# Patient Record
Sex: Male | Born: 1970 | Race: White | Hispanic: No | Marital: Married | Smoking: Former smoker
Health system: Southern US, Community
[De-identification: ages and names within clinical notes are randomized; demographics above are authoritative.]

## PROBLEM LIST (undated history)

## (undated) ENCOUNTER — Emergency Department: Discharge status: Absent without leave | Payer: BC Managed Care – PPO

## (undated) DIAGNOSIS — I10 Essential (primary) hypertension: Secondary | ICD-10-CM

## (undated) DIAGNOSIS — G4730 Sleep apnea, unspecified: Secondary | ICD-10-CM

## (undated) DIAGNOSIS — E119 Type 2 diabetes mellitus without complications: Secondary | ICD-10-CM

## (undated) HISTORY — PX: NOSE SURGERY: SHX723

---

## 2003-04-26 ENCOUNTER — Inpatient Hospital Stay (HOSPITAL_COMMUNITY): Admission: EM | Admit: 2003-04-26 | Discharge: 2003-04-27 | Payer: Self-pay | Admitting: Psychiatry

## 2007-05-06 ENCOUNTER — Ambulatory Visit: Payer: Self-pay | Admitting: Nurse Practitioner

## 2007-05-11 ENCOUNTER — Ambulatory Visit: Payer: Self-pay | Admitting: Nurse Practitioner

## 2008-02-13 ENCOUNTER — Emergency Department: Payer: Self-pay | Admitting: Emergency Medicine

## 2009-02-13 ENCOUNTER — Emergency Department: Payer: Self-pay | Admitting: Emergency Medicine

## 2009-03-13 ENCOUNTER — Ambulatory Visit: Payer: Self-pay | Admitting: Otolaryngology

## 2009-03-19 ENCOUNTER — Ambulatory Visit: Payer: Self-pay | Admitting: Otolaryngology

## 2009-05-01 ENCOUNTER — Ambulatory Visit: Payer: Self-pay | Admitting: Family Medicine

## 2009-06-05 ENCOUNTER — Ambulatory Visit: Payer: Self-pay | Admitting: Family Medicine

## 2010-07-22 ENCOUNTER — Ambulatory Visit: Payer: Self-pay | Admitting: Internal Medicine

## 2011-01-22 ENCOUNTER — Ambulatory Visit: Payer: Self-pay | Admitting: Family Medicine

## 2011-01-24 ENCOUNTER — Ambulatory Visit: Payer: Self-pay | Admitting: Internal Medicine

## 2011-01-31 ENCOUNTER — Ambulatory Visit: Payer: Self-pay | Admitting: Internal Medicine

## 2011-11-21 ENCOUNTER — Emergency Department: Payer: Self-pay | Admitting: Emergency Medicine

## 2012-08-06 ENCOUNTER — Emergency Department: Payer: Self-pay | Admitting: Emergency Medicine

## 2012-08-06 LAB — CBC
HCT: 47.2 % (ref 40.0–52.0)
HGB: 16.2 g/dL (ref 13.0–18.0)
MCH: 32.8 pg (ref 26.0–34.0)
MCHC: 34.4 g/dL (ref 32.0–36.0)
WBC: 10.4 10*3/uL (ref 3.8–10.6)

## 2012-08-06 LAB — URINALYSIS, COMPLETE
Bilirubin,UR: NEGATIVE
Glucose,UR: NEGATIVE mg/dL (ref 0–75)
Ketone: NEGATIVE
Protein: NEGATIVE
RBC,UR: 133 /HPF (ref 0–5)
Specific Gravity: 1.023 (ref 1.003–1.030)
Squamous Epithelial: NONE SEEN
WBC UR: 2 /HPF (ref 0–5)

## 2012-08-06 LAB — BASIC METABOLIC PANEL
BUN: 15 mg/dL (ref 7–18)
Creatinine: 0.98 mg/dL (ref 0.60–1.30)
EGFR (Non-African Amer.): >60
Glucose: 109 mg/dL — ABNORMAL HIGH (ref 65–99)
Potassium: 3.8 mmol/L (ref 3.5–5.1)
Sodium: 141 mmol/L (ref 136–145)

## 2012-08-07 ENCOUNTER — Emergency Department: Payer: Self-pay | Admitting: Emergency Medicine

## 2014-08-08 ENCOUNTER — Emergency Department: Payer: Self-pay | Admitting: Internal Medicine

## 2014-08-08 LAB — CBC
HCT: 45.5 % (ref 40.0–52.0)
HGB: 15.4 g/dL (ref 13.0–18.0)
MCH: 31.5 pg (ref 26.0–34.0)
MCHC: 33.8 g/dL (ref 32.0–36.0)
MCV: 93 fL (ref 80–100)
Platelet: 188 10*3/uL (ref 150–440)
RBC: 4.87 10*6/uL (ref 4.40–5.90)
RDW: 12.7 % (ref 11.5–14.5)
WBC: 8.1 10*3/uL (ref 3.8–10.6)

## 2014-08-08 LAB — COMPREHENSIVE METABOLIC PANEL
ALBUMIN: 3.6 g/dL (ref 3.4–5.0)
ALK PHOS: 121 U/L — AB
ALT: 49 U/L
ANION GAP: 8 (ref 7–16)
AST: 30 U/L (ref 15–37)
BILIRUBIN TOTAL: 0.5 mg/dL (ref 0.2–1.0)
BUN: 9 mg/dL (ref 7–18)
CREATININE: 1.08 mg/dL (ref 0.60–1.30)
Calcium, Total: 8.8 mg/dL (ref 8.5–10.1)
Chloride: 101 mmol/L (ref 98–107)
Co2: 25 mmol/L (ref 21–32)
EGFR (African American): >60
GLUCOSE: 498 mg/dL — AB (ref 65–99)
OSMOLALITY: 289 (ref 275–301)
Potassium: 3.9 mmol/L (ref 3.5–5.1)
Sodium: 134 mmol/L — ABNORMAL LOW (ref 136–145)
TOTAL PROTEIN: 7 g/dL (ref 6.4–8.2)

## 2014-08-08 LAB — URINALYSIS, COMPLETE
BILIRUBIN, UR: NEGATIVE
Bacteria: NONE SEEN
Glucose,UR: >=500 mg/dL (ref 0–75)
Ketone: NEGATIVE
LEUKOCYTE ESTERASE: NEGATIVE
NITRITE: NEGATIVE
Ph: 6 (ref 4.5–8.0)
Protein: NEGATIVE
SPECIFIC GRAVITY: 1.027 (ref 1.003–1.030)
Squamous Epithelial: NONE SEEN

## 2014-09-27 ENCOUNTER — Ambulatory Visit: Payer: Self-pay | Admitting: Family Medicine

## 2015-02-05 ENCOUNTER — Encounter: Payer: Self-pay | Admitting: Emergency Medicine

## 2015-02-05 ENCOUNTER — Emergency Department
Admission: EM | Admit: 2015-02-05 | Discharge: 2015-02-05 | Disposition: A | Discharge status: Home / Self Care | Payer: BLUE CROSS/BLUE SHIELD | Attending: Emergency Medicine | Admitting: Emergency Medicine

## 2015-02-05 DIAGNOSIS — Z87891 Personal history of nicotine dependence: Secondary | ICD-10-CM | POA: Insufficient documentation

## 2015-02-05 DIAGNOSIS — I10 Essential (primary) hypertension: Secondary | ICD-10-CM | POA: Diagnosis not present

## 2015-02-05 DIAGNOSIS — R079 Chest pain, unspecified: Secondary | ICD-10-CM | POA: Diagnosis present

## 2015-02-05 DIAGNOSIS — K2970 Gastritis, unspecified, without bleeding: Secondary | ICD-10-CM

## 2015-02-05 DIAGNOSIS — E119 Type 2 diabetes mellitus without complications: Secondary | ICD-10-CM | POA: Insufficient documentation

## 2015-02-05 HISTORY — DX: Essential (primary) hypertension: I10

## 2015-02-05 HISTORY — DX: Type 2 diabetes mellitus without complications: E11.9

## 2015-02-05 HISTORY — DX: Sleep apnea, unspecified: G47.30

## 2015-02-05 LAB — BASIC METABOLIC PANEL
Anion gap: 4 — ABNORMAL LOW (ref 5–15)
BUN: 16 mg/dL (ref 6–20)
CO2: 26 mmol/L (ref 22–32)
Calcium: 8.9 mg/dL (ref 8.9–10.3)
Chloride: 109 mmol/L (ref 101–111)
Creatinine, Ser: 0.94 mg/dL (ref 0.61–1.24)
GFR calc non Af Amer: >60 mL/min (ref >60)
GLUCOSE: 130 mg/dL — AB (ref 65–99)
POTASSIUM: 4.1 mmol/L (ref 3.5–5.1)
Sodium: 139 mmol/L (ref 135–145)

## 2015-02-05 LAB — CBC
HEMATOCRIT: 47.1 % (ref 40.0–52.0)
Hemoglobin: 15.6 g/dL (ref 13.0–18.0)
MCH: 31.1 pg (ref 26.0–34.0)
MCHC: 33.2 g/dL (ref 32.0–36.0)
MCV: 93.7 fL (ref 80.0–100.0)
Platelets: 216 10*3/uL (ref 150–440)
RBC: 5.03 MIL/uL (ref 4.40–5.90)
RDW: 12.6 % (ref 11.5–14.5)
WBC: 9.8 10*3/uL (ref 3.8–10.6)

## 2015-02-05 LAB — TROPONIN I: Troponin I: <0.03 ng/mL (ref <0.031)

## 2015-02-05 MED ORDER — FAMOTIDINE 40 MG PO TABS: 40.0000 mg | ORAL_TABLET | Freq: Every day | ORAL | Status: DC

## 2015-02-05 MED ORDER — GI COCKTAIL ~~LOC~~
30.0000 mL | Freq: Once | ORAL | Status: AC
  Administered 2015-02-05: 30 mL via ORAL

## 2015-02-05 MED ORDER — SUCRALFATE 1 G PO TABS
1.0000 g | ORAL_TABLET | Freq: Four times a day (QID) | ORAL | Status: DC
Start: 2015-02-05 — End: 2017-12-10

## 2015-02-05 MED ORDER — GI COCKTAIL ~~LOC~~
ORAL | Status: AC
Start: 2015-02-05 — End: 2015-02-05
  Administered 2015-02-05: 30 mL via ORAL
  Filled 2015-02-05: qty 30

## 2015-02-05 NOTE — Discharge Instructions (Signed)
Please seek medical attention for any high fevers, chest pain, shortness of breath, change in behavior, persistent vomiting, bloody stool or any other new or concerning symptoms. ° °Gastritis, Adult °Gastritis is soreness and swelling (inflammation) of the lining of the stomach. Gastritis can develop as a sudden onset (acute) or Belter-term (chronic) condition. If gastritis is not treated, it can lead to stomach bleeding and ulcers. °CAUSES  °Gastritis occurs when the stomach lining is weak or damaged. Digestive juices from the stomach then inflame the weakened stomach lining. The stomach lining may be weak or damaged due to viral or bacterial infections. One common bacterial infection is the Helicobacter pylori infection. Gastritis can also result from excessive alcohol consumption, taking certain medicines, or having too much acid in the stomach.  °SYMPTOMS  °In some cases, there are no symptoms. When symptoms are present, they may include: °· Pain or a burning sensation in the upper abdomen. °· Nausea. °· Vomiting. °· An uncomfortable feeling of fullness after eating. °DIAGNOSIS  °Your caregiver may suspect you have gastritis based on your symptoms and a physical exam. To determine the cause of your gastritis, your caregiver may perform the following: °· Blood or stool tests to check for the H pylori bacterium. °· Gastroscopy. A thin, flexible tube (endoscope) is passed down the esophagus and into the stomach. The endoscope has a light and camera on the end. Your caregiver uses the endoscope to view the inside of the stomach. °· Taking a tissue sample (biopsy) from the stomach to examine under a microscope. °TREATMENT  °Depending on the cause of your gastritis, medicines may be prescribed. If you have a bacterial infection, such as an H pylori infection, antibiotics may be given. If your gastritis is caused by too much acid in the stomach, H2 blockers or antacids may be given. Your caregiver may recommend that you  stop taking aspirin, ibuprofen, or other nonsteroidal anti-inflammatory drugs (NSAIDs). °HOME CARE INSTRUCTIONS °· Only take over-the-counter or prescription medicines as directed by your caregiver. °· If you were given antibiotic medicines, take them as directed. Finish them even if you start to feel better. °· Drink enough fluids to keep your urine clear or pale yellow. °· Avoid foods and drinks that make your symptoms worse, such as: °¨ Caffeine or alcoholic drinks. °¨ Chocolate. °¨ Peppermint or mint flavorings. °¨ Garlic and onions. °¨ Spicy foods. °¨ Citrus fruits, such as oranges, lemons, or limes. °¨ Tomato-based foods such as sauce, chili, salsa, and pizza. °¨ Fried and fatty foods. °· Eat small, frequent meals instead of large meals. °SEEK IMMEDIATE MEDICAL CARE IF:  °· You have black or dark red stools. °· You vomit blood or material that looks like coffee grounds. °· You are unable to keep fluids down. °· Your abdominal pain gets worse. °· You have a fever. °· You do not feel better after 1 week. °· You have any other questions or concerns. °MAKE SURE YOU: °· Understand these instructions. °· Will watch your condition. °· Will get help right away if you are not doing well or get worse. °Document Released: 08/05/2001 Document Revised: 02/10/2012 Document Reviewed: 09/24/2011 °[employer]® Patient Information ©2015 [employer]. This information is not intended to replace advice given to you by your health care provider. Make sure you discuss any questions you have with your health care provider. ° °

## 2015-02-05 NOTE — ED Notes (Signed)
Back pain / diarrhea ( dark stool, jet black) , x2 days , chest pain/ RUQ pain x1 day . Came by EMS from from [facility] care , was given aspirin and nitro

## 2015-02-05 NOTE — ED Notes (Signed)
States he developed pain to RLQ   Describes as squeezing non radiating. Pain became worse this am went to see pmd. And instructed to come to ED  Presents with family via ems.

## 2015-02-05 NOTE — ED Provider Notes (Signed)
[facility] Emergency Department Provider Note    ____________________________________________  Time seen: 1100   I have reviewed the triage vital signs and the nursing notes.   HISTORY  Chief Complaint Chest Pain   History limited by: Not Limited   HPI Justin Powell is a 44 y.o. male presents to the emergency department today from physician's office because of concerns for chest pain and abdominal pain.the patient states that he has had some abdominal pain for 1 day. It is located in the upper abdomen. He states that he also has some black stool yesterday. Patient has had some nausea no vomiting. Denies any fevers. He also had an episode of some epigastric and right lower chest pain. Patient denies similar symptoms in the past.     Past Medical History  Diagnosis Date  . Hypertension   . Diabetes mellitus without complication   . Sleep apnea     There are no active problems to display for this patient.   Past Surgical History  Procedure Laterality Date  . Nose surgery      No current outpatient prescriptions on file.  Allergies Review of patient's allergies indicates no known allergies.  No family history on file.  Social History History  Substance Use Topics  . Smoking status: Former Games developer  . Smokeless tobacco: Not on file  . Alcohol Use: No    Review of Systems  Constitutional: Negative for fever. Cardiovascular: positive for chest pain. Respiratory: Negative for shortness of breath. Gastrointestinal: positive for abdominal pain, denies vomiting and diarrhea. Genitourinary: Negative for dysuria. Musculoskeletal: Negative for back pain. Skin: Negative for rash. Neurological: Negative for headaches, focal weakness or numbness.   10-point ROS otherwise negative.  ____________________________________________   PHYSICAL EXAM:  VITAL SIGNS: ED Triage Vitals  Enc Vitals Group     BP 02/05/15 1041 98/63 mmHg   Pulse Rate 02/05/15 1041 84     Resp 02/05/15 1041 20     Temp --      Temp src --      SpO2 02/05/15 1041 96 %     Weight 02/05/15 1041 315 lb (142.883 kg)     Height 02/05/15 1041 5\' 8"  (1.727 m)     Head Cir --      Peak Flow --      Pain Score 02/05/15 1042 3   Constitutional: Alert and oriented. Well appearing and in no distress. Eyes: Conjunctivae are normal. PERRL. Normal extraocular movements. ENT   Head: Normocephalic and atraumatic.   Nose: No congestion/rhinnorhea.   Mouth/Throat: Mucous membranes are moist.   Neck: No stridor. Hematological/Lymphatic/Immunilogical: No cervical lymphadenopathy. Cardiovascular: Normal rate, regular rhythm.  No murmurs, rubs, or gallops. Respiratory: Normal respiratory effort without tachypnea nor retractions. Breath sounds are clear and equal bilaterally. No wheezes/rales/rhonchi. Gastrointestinal: Soft and nontender. No distention. There is no CVA tenderness. Rectal exam with Hemoccult negative. Genitourinary: Deferred Musculoskeletal: Normal range of motion in all extremities. No joint effusions.  No lower extremity tenderness nor edema. Neurologic:  Normal speech and language. No gross focal neurologic deficits are appreciated. Speech is normal.  Skin:  Skin is warm, dry and intact. No rash noted. Psychiatric: Mood and affect are normal. Speech and behavior are normal. Patient exhibits appropriate insight and judgment.  ____________________________________________    LABS (pertinent positives/negatives)  Labs Reviewed  BASIC METABOLIC PANEL - Abnormal; Notable for the following:    Glucose, Bld 130 (*)    Anion gap 4 (*)  All other components within normal limits  CBC  TROPONIN I     ____________________________________________   EKG  I, Phineas Semen, attending physician, personally viewed and interpreted this EKG  EKG Time: 1034 Rate: 83 Rhythm: NSR Axis: Normal Intervals: qtc 425 QRS: narrow ST  changes: no st elevation    ____________________________________________    RADIOLOGY  None  ____________________________________________   PROCEDURES  Procedure(s) performed: None  Critical Care performed: No  ____________________________________________   INITIAL IMPRESSION / ASSESSMENT AND PLAN / ED COURSE  Pertinent labs & imaging results that were available during my care of the patient were reviewed by me and considered in my medical decision making (see chart for details).  Patient presented emergency department with complaint of chest pain, back pain and diarrhea. On exam patient awake, alert no acute distress. Abdominal exam with some epigastric abdominal pain. Rectal exam was guaiac negative. Blood work without any concerning findings. Given the patient did have some self-reported black tarry stools and some epigastric pain do have concerns of this might be gastric ulcers or gastritis. Discussed this with patient. Will prescribe anti-acid as well as sucralfate. Discussed return precautions.  ____________________________________________   FINAL CLINICAL IMPRESSION(S) / ED DIAGNOSES  Final diagnoses:  Gastritis     Phineas Semen, MD 02/05/15 1645

## 2015-03-15 DIAGNOSIS — I10 Essential (primary) hypertension: Secondary | ICD-10-CM | POA: Insufficient documentation

## 2015-03-15 DIAGNOSIS — E782 Mixed hyperlipidemia: Secondary | ICD-10-CM | POA: Insufficient documentation

## 2015-03-15 DIAGNOSIS — G4733 Obstructive sleep apnea (adult) (pediatric): Secondary | ICD-10-CM | POA: Insufficient documentation

## 2015-03-15 DIAGNOSIS — Z9989 Dependence on other enabling machines and devices: Secondary | ICD-10-CM

## 2015-03-15 DIAGNOSIS — I25119 Atherosclerotic heart disease of native coronary artery with unspecified angina pectoris: Secondary | ICD-10-CM | POA: Insufficient documentation

## 2015-07-05 DIAGNOSIS — I240 Acute coronary thrombosis not resulting in myocardial infarction: Secondary | ICD-10-CM | POA: Insufficient documentation

## 2015-07-05 DIAGNOSIS — I259 Chronic ischemic heart disease, unspecified: Secondary | ICD-10-CM | POA: Insufficient documentation

## 2017-12-10 ENCOUNTER — Ambulatory Visit (INDEPENDENT_AMBULATORY_CARE_PROVIDER_SITE_OTHER): Payer: BLUE CROSS/BLUE SHIELD | Admitting: Podiatry

## 2017-12-10 ENCOUNTER — Encounter: Payer: Self-pay | Admitting: Podiatry

## 2017-12-10 DIAGNOSIS — E119 Type 2 diabetes mellitus without complications: Secondary | ICD-10-CM

## 2017-12-10 NOTE — Progress Notes (Signed)
This patient presents to the office with chief complaint of  diabetic feet.  This patient  says he is having no pain and discomfort in his feet.  This patient was sent to this office for diabetic foot exam..  He has no history of infection or drainage from both feet.  This patient presents the office today for treatment and  foot evaluation due to history of  diabetes.  General Appearance  Alert, conversant and in no acute stress.  Vascular  Dorsalis pedis and posterior tibial  pulses are palpable  bilaterally.  Capillary return is within normal limits  bilaterally. Temperature is within normal limits  bilaterally.  Neurologic  Senn-Weinstein monofilament wire test within normal limits  bilaterally. Muscle power within normal limits bilaterally.  Nails Normalnails noted with no evidence of bacterial or fungal infection.  Orthopedic  No limitations of motion of motion feet .  No crepitus or effusions noted.  No bony pathology or digital deformities noted.  Skin  normotropic skin with no porokeratosis noted bilaterally.  No signs of infections or ulcers noted.   Plantar tyloma noted which is asymptomatic.  Diabetes with no foot complications  IE   A diabetic foot exam was performed and there is no evidence of any vascular or neurologic pathology.   RTC 1 year.   Helane GuntherGregory Orlando Devereux DPM

## 2017-12-25 ENCOUNTER — Ambulatory Visit: Payer: Self-pay | Admitting: Podiatry

## 2018-09-18 ENCOUNTER — Ambulatory Visit
Admission: RE | Admit: 2018-09-18 | Discharge: 2018-09-18 | Disposition: A | Discharge status: Home / Self Care | Payer: BLUE CROSS/BLUE SHIELD | Source: Ambulatory Visit | Attending: Physician Assistant | Admitting: Physician Assistant

## 2018-09-18 ENCOUNTER — Other Ambulatory Visit: Payer: Self-pay | Admitting: Physician Assistant

## 2018-09-18 DIAGNOSIS — M25562 Pain in left knee: Secondary | ICD-10-CM

## 2020-01-29 ENCOUNTER — Ambulatory Visit: Admission: RE | Admit: 2020-01-29 | Payer: BC Managed Care – PPO | Source: Ambulatory Visit

## 2020-01-29 ENCOUNTER — Ambulatory Visit
Admit: 2020-01-29 | Discharge: 2020-01-29 | Disposition: A | Discharge status: Home / Self Care | Payer: BC Managed Care – PPO | Source: Ambulatory Visit | Attending: Family | Admitting: Family

## 2020-01-29 DIAGNOSIS — M47816 Spondylosis without myelopathy or radiculopathy, lumbar region: Secondary | ICD-10-CM | POA: Insufficient documentation

## 2020-01-29 DIAGNOSIS — M545 Low back pain: Secondary | ICD-10-CM | POA: Diagnosis present

## 2021-08-07 IMAGING — CR DG LUMBAR SPINE COMPLETE 4+V
5 series · 5 of 5 positions shown · non-contrast
Comparison: None.

CLINICAL DATA: Low back pain for 3 months.

EXAM:
LUMBAR SPINE - COMPLETE 4+ VIEW

[l-spine ap]
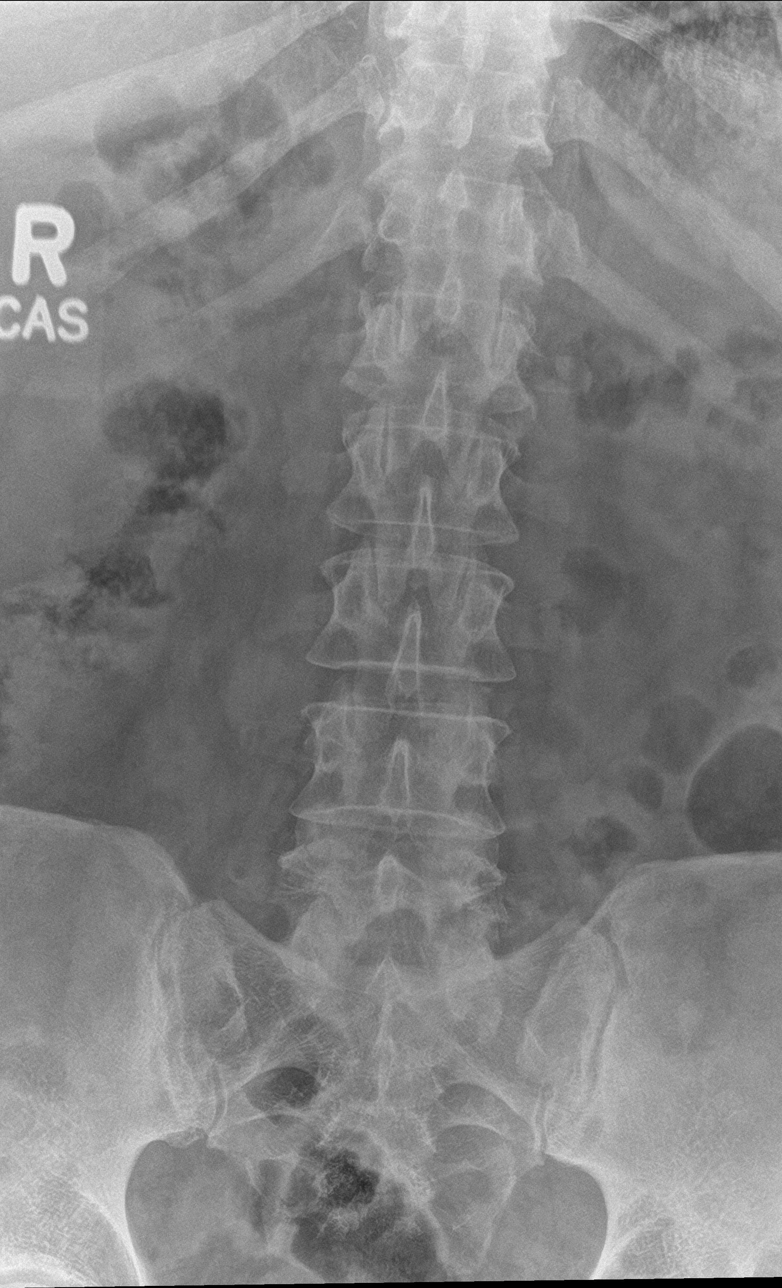

[l-spine obl (1 of 2)]
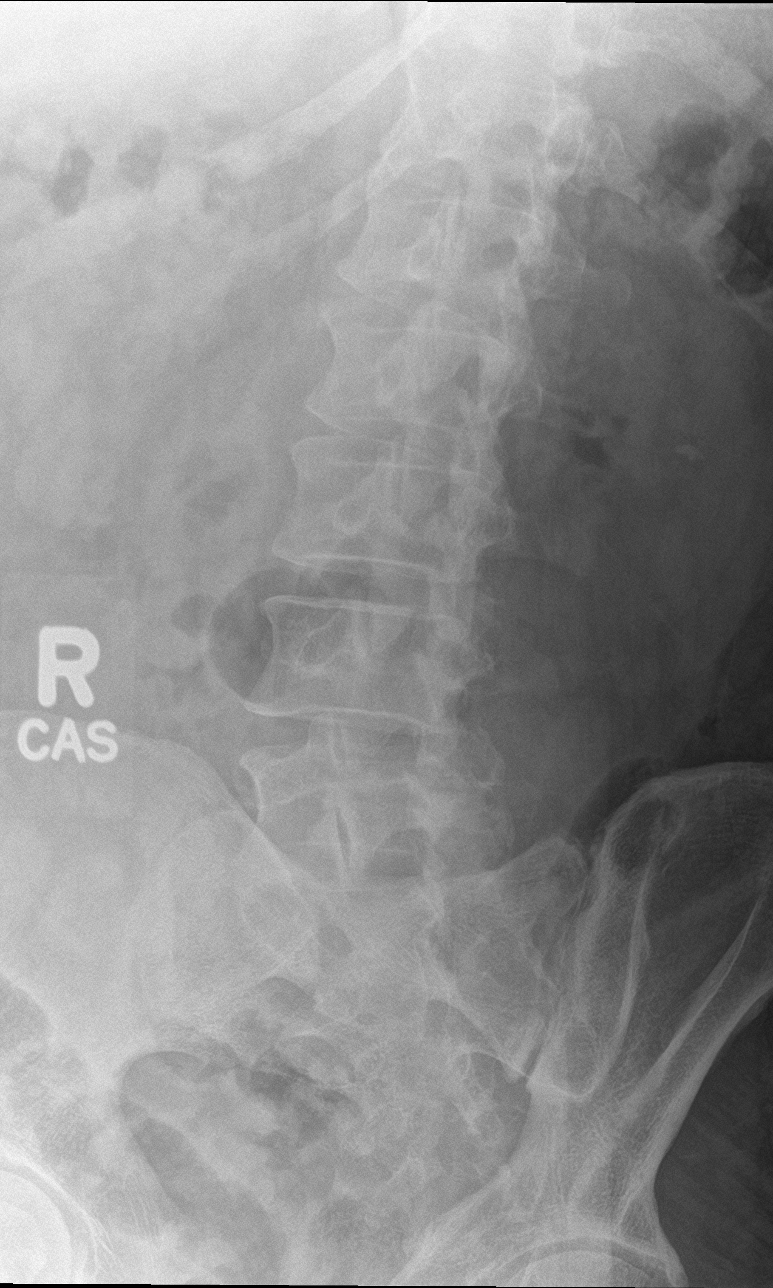

[l-spine obl (2 of 2)]
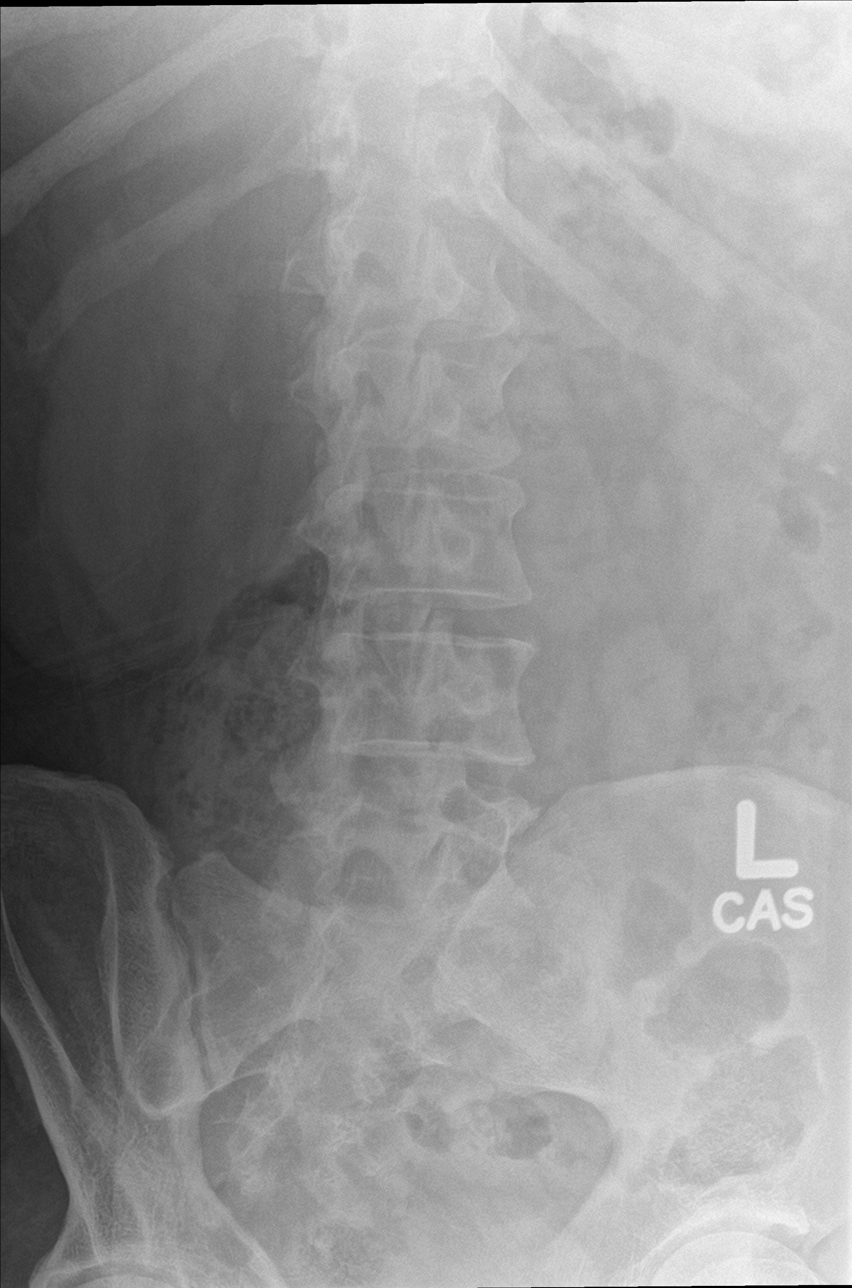

[l-spine lat]
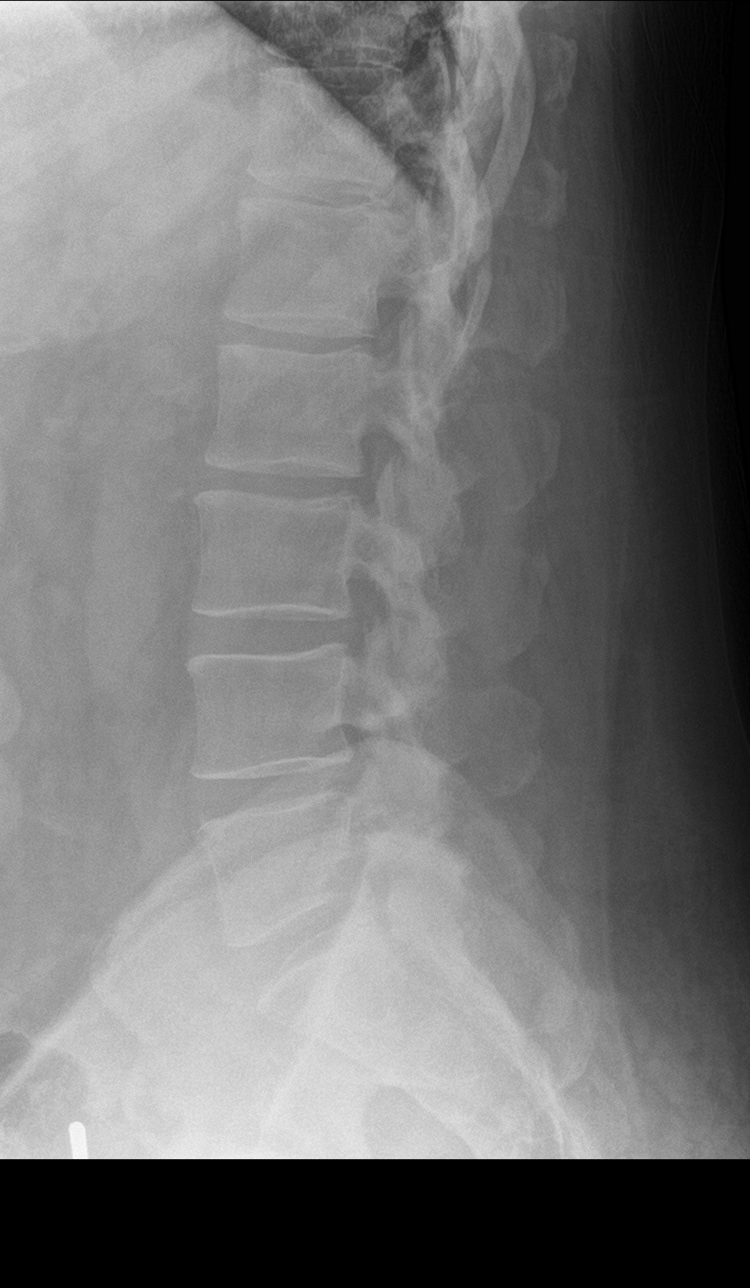

[l-spine spot]
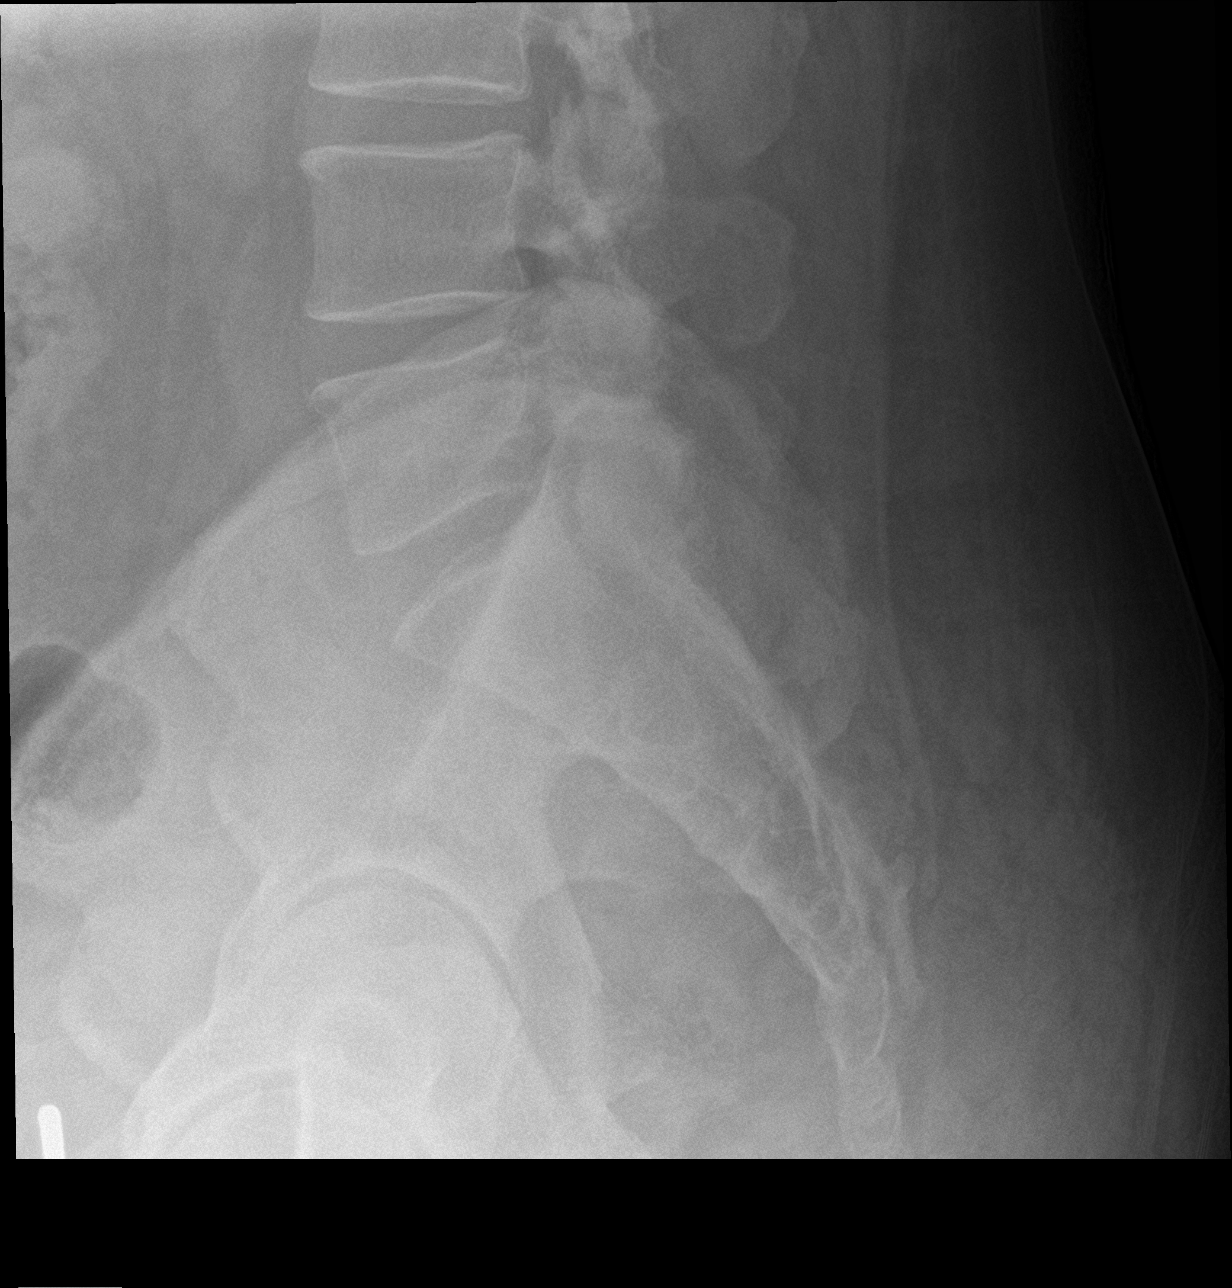

[5 of 5 positions shown; findings below may reference images not displayed]

FINDINGS: Normal anatomic alignment. No evidence for acute fracture or
dislocation. Preservation of the vertebral body and intervertebral
disc space heights. Lower lumbar spine facet degenerative changes.
IMPRESSION: Lower lumbar spine facet degenerative changes.

## 2023-08-04 ENCOUNTER — Ambulatory Visit (INDEPENDENT_AMBULATORY_CARE_PROVIDER_SITE_OTHER): Payer: BC Managed Care – PPO | Admitting: Podiatry

## 2023-08-04 ENCOUNTER — Encounter: Payer: Self-pay | Admitting: Podiatry

## 2023-08-04 VITALS — Ht 68.0 in | Wt 315.0 lb

## 2023-08-04 DIAGNOSIS — Q828 Other specified congenital malformations of skin: Secondary | ICD-10-CM | POA: Diagnosis not present

## 2023-08-04 NOTE — Progress Notes (Signed)
Subjective:  Patient ID: Justin Powell, male    DOB: 1971-04-24,  MRN: [id number]  Chief Complaint  Patient presents with   Callouses    PT is here due to callouses or warts on his right foot pt states the pain is on and off mostly on, he is on his feet a lot due to work    52 y.o. male presents with the above complaint.  Patient presents with complaint of right submetatarsal 5 porokeratotic lesion/benign skin lesion.  Painful patient states is painful to touch is progressive gotten worse.  He is a diabetic.  Pain scale 7 out of 10 dull aching nature.  Hurts when he is on his feet to work.   Review of Systems: Negative except as noted in the HPI. Denies N/V/F/Ch.  Past Medical History:  Diagnosis Date   Diabetes mellitus without complication (HCC)    Hypertension    Sleep apnea     Current Outpatient Medications:    atorvastatin (LIPITOR) 80 MG tablet, Take 80 mg by mouth every evening., Disp: , Rfl:    clindamycin (CLEOCIN T) 1 % lotion, 1 %., Disp: , Rfl:    clobetasol ointment (TEMOVATE) 0.05 %, Apply to areas of rash twice daily as needed until resolved. Restart as needed, Disp: , Rfl:    clotrimazole-betamethasone (LOTRISONE) cream, clotrimazole-betamethasone 1 %-0.05 % topical cream  APP EXT AA BID FOR UP TO 2 WKS, Disp: , Rfl:    cyclobenzaprine (FLEXERIL) 10 MG tablet, Take 10 mg by mouth 3 (three) times daily as needed for muscle spasms., Disp: , Rfl:    escitalopram (LEXAPRO) 10 MG tablet, escitalopram 10 mg tablet  TK 1 T PO D FOR DEPRESSION, Disp: , Rfl:    fluocinonide (LIDEX) 0.05 % external solution, APPLY TO EARS TWICE DAILY AS NEEDED, Disp: , Rfl:    gabapentin (NEURONTIN) 100 MG capsule, Take 200 mg by mouth 3 (three) times daily., Disp: , Rfl:    HYDROcodone-acetaminophen (NORCO/VICODIN) 5-325 MG tablet, TK 1 T PO Q 6 H PRN P, Disp: , Rfl: 0   linagliptin (TRADJENTA) 5 MG TABS tablet, Take 5 mg by mouth every evening., Disp: , Rfl:    lisinopril (PRINIVIL,ZESTRIL)  5 MG tablet, Take 5 mg by mouth every evening., Disp: , Rfl:    meloxicam (MOBIC) 15 MG tablet, meloxicam 15 mg tablet  TK 1 T PO D P, Disp: , Rfl:    metFORMIN (GLUCOPHAGE-XR) 500 MG 24 hr tablet, Take 500 mg by mouth every evening., Disp: , Rfl:    metoprolol succinate (TOPROL-XL) 25 MG 24 hr tablet, metoprolol succinate ER 25 mg tablet,extended release 24 hr, Disp: , Rfl:    naproxen (NAPROSYN) 500 MG tablet, TK 1 T PO BID PRN P, Disp: , Rfl: 0   nitroGLYCERIN (NITROSTAT) 0.4 MG SL tablet, Nitrostat 0.4 mg sublingual tablet  TNT Q 5 MINUTES PRN FOR CHEST PAIN, Disp: , Rfl:    orphenadrine (NORFLEX) 100 MG tablet, TK 1 T PO BID PRF MUSCLE PAIN, Disp: , Rfl: 0   oxycodone (OXY-IR) 5 MG capsule, oxycodone 5 mg capsule  Take 1 capsule every 6 hours by oral route., Disp: , Rfl:    triamcinolone ointment (KENALOG) 0.1 %, APP EXT AA BID FOR UP TO 2 WEEKS, Disp: , Rfl: 1  Social History   Tobacco Use  Smoking Status Former  Smokeless Tobacco Not on file    No Known Allergies Objective:  There were no vitals filed for this visit.  Body mass index is 47.9 kg/m. Constitutional Well developed. Well nourished.  Vascular Dorsalis pedis pulses palpable bilaterally. Posterior tibial pulses palpable bilaterally. Capillary refill normal to all digits.  No cyanosis or clubbing noted. Pedal hair growth normal.  Neurologic Normal speech. Oriented to person, place, and time. Epicritic sensation to light touch grossly present bilaterally.  Dermatologic Right submetatarsal 5 porokeratotic lesions noted. 4.  Painful to touch.  No pinpoint bleeding noted upon debridement  Orthopedic: Normal joint ROM without pain or crepitus bilaterally. No visible deformities. No bony tenderness.   Radiographs: None Assessment:   1. Porokeratosis    Plan:  Patient was evaluated and treated and all questions answered.  Right submetatarsal 5 porokeratosis -All questions and concerns were discussed with the  patient in extensive detail.  Patient would benefit aggressive debridement of the lesion using abrasion and the lesion was debrided and healthy dry tissue no complication noted no pinpoint bleeding noted. -Shoe gear modification discussed  No follow-ups on file.

## 2023-09-17 ENCOUNTER — Encounter: Payer: Self-pay | Admitting: Podiatry

## 2023-09-17 ENCOUNTER — Ambulatory Visit (INDEPENDENT_AMBULATORY_CARE_PROVIDER_SITE_OTHER): Payer: BC Managed Care – PPO | Admitting: Podiatry

## 2023-09-17 DIAGNOSIS — Q828 Other specified congenital malformations of skin: Secondary | ICD-10-CM | POA: Diagnosis not present

## 2023-09-17 NOTE — Progress Notes (Signed)
Subjective:  Patient ID: Justin Powell, male    DOB: 01/15/1971,  MRN: [id number]  Chief Complaint  Patient presents with   Foot Pain    "They're better."    53 y.o. male presents with the above complaint.  Patient presents with complaint of right submetatarsal 5 porokeratotic lesion/benign skin lesion.  He states is doing better.  He is a diabetic.  Denies any other acute complaints  Review of Systems: Negative except as noted in the HPI. Denies N/V/F/Ch.  Past Medical History:  Diagnosis Date   Diabetes mellitus without complication (HCC)    Hypertension    Sleep apnea     Current Outpatient Medications:    Apremilast 10 & 20 & 30 MG TBPK, Take by mouth., Disp: , Rfl:    atorvastatin (LIPITOR) 80 MG tablet, Take 80 mg by mouth every evening., Disp: , Rfl:    clindamycin (CLEOCIN T) 1 % lotion, 1 %., Disp: , Rfl:    clobetasol ointment (TEMOVATE) 0.05 %, Apply to areas of rash twice daily as needed until resolved. Restart as needed, Disp: , Rfl:    clotrimazole-betamethasone (LOTRISONE) cream, clotrimazole-betamethasone 1 %-0.05 % topical cream  APP EXT AA BID FOR UP TO 2 WKS, Disp: , Rfl:    cyclobenzaprine (FLEXERIL) 10 MG tablet, Take 10 mg by mouth 3 (three) times daily as needed for muscle spasms., Disp: , Rfl:    escitalopram (LEXAPRO) 10 MG tablet, escitalopram 10 mg tablet  TK 1 T PO D FOR DEPRESSION, Disp: , Rfl:    fluocinonide (LIDEX) 0.05 % external solution, APPLY TO EARS TWICE DAILY AS NEEDED, Disp: , Rfl:    gabapentin (NEURONTIN) 100 MG capsule, Take 200 mg by mouth 3 (three) times daily., Disp: , Rfl:    HYDROcodone-acetaminophen (NORCO/VICODIN) 5-325 MG tablet, TK 1 T PO Q 6 H PRN P, Disp: , Rfl: 0   linagliptin (TRADJENTA) 5 MG TABS tablet, Take 5 mg by mouth every evening., Disp: , Rfl:    lisinopril (PRINIVIL,ZESTRIL) 5 MG tablet, Take 5 mg by mouth every evening., Disp: , Rfl:    meloxicam (MOBIC) 15 MG tablet, meloxicam 15 mg tablet  TK 1 T PO D P, Disp: ,  Rfl:    metFORMIN (GLUCOPHAGE-XR) 500 MG 24 hr tablet, Take 500 mg by mouth every evening., Disp: , Rfl:    metoprolol succinate (TOPROL-XL) 25 MG 24 hr tablet, metoprolol succinate ER 25 mg tablet,extended release 24 hr, Disp: , Rfl:    naproxen (NAPROSYN) 500 MG tablet, TK 1 T PO BID PRN P, Disp: , Rfl: 0   nitroGLYCERIN (NITROSTAT) 0.4 MG SL tablet, Nitrostat 0.4 mg sublingual tablet  TNT Q 5 MINUTES PRN FOR CHEST PAIN, Disp: , Rfl:    orphenadrine (NORFLEX) 100 MG tablet, TK 1 T PO BID PRF MUSCLE PAIN, Disp: , Rfl: 0   oxycodone (OXY-IR) 5 MG capsule, oxycodone 5 mg capsule  Take 1 capsule every 6 hours by oral route., Disp: , Rfl:    triamcinolone ointment (KENALOG) 0.1 %, APP EXT AA BID FOR UP TO 2 WEEKS, Disp: , Rfl: 1  Social History   Tobacco Use  Smoking Status Former  Smokeless Tobacco Not on file    No Known Allergies Objective:  There were no vitals filed for this visit. There is no height or weight on file to calculate BMI. Constitutional Well developed. Well nourished.  Vascular Dorsalis pedis pulses palpable bilaterally. Posterior tibial pulses palpable bilaterally. Capillary refill normal to all  digits.  No cyanosis or clubbing noted. Pedal hair growth normal.  Neurologic Normal speech. Oriented to person, place, and time. Epicritic sensation to light touch grossly present bilaterally.  Dermatologic Right submetatarsal 5 porokeratotic lesions noted. 4.  Painful to touch.  No pinpoint bleeding noted upon debridement  Orthopedic: Normal joint ROM without pain or crepitus bilaterally. No visible deformities. No bony tenderness.   Radiographs: None Assessment:   No diagnosis found.  Plan:  Patient was evaluated and treated and all questions answered.  Right submetatarsal 5 porokeratosis -All questions and concerns were discussed with the patient in extensive detail.  Patient would benefit aggressive debridement of the lesion using abrasion and the lesion was  debrided and healthy dry tissue no complication noted no pinpoint bleeding noted. -Shoe gear modification discussed  No follow-ups on file.
